# Patient Record
Sex: Male | Born: 1960 | Race: White | Hispanic: Yes | Marital: Married
Health system: Southern US, Community
[De-identification: ages and names within clinical notes are randomized; demographics above are authoritative.]

## PROBLEM LIST (undated history)

## (undated) HISTORY — PX: NO PRIOR SURGERIES: 100

---

## 2001-08-19 ENCOUNTER — Encounter: Payer: Self-pay | Admitting: Emergency Medicine

## 2001-08-19 ENCOUNTER — Emergency Department (HOSPITAL_COMMUNITY): Admission: EM | Admit: 2001-08-19 | Discharge: 2001-08-19 | Payer: Self-pay | Admitting: Emergency Medicine

## 2001-09-29 ENCOUNTER — Inpatient Hospital Stay (HOSPITAL_COMMUNITY): Admission: EM | Admit: 2001-09-29 | Discharge: 2001-09-30 | Payer: Self-pay | Admitting: Emergency Medicine

## 2001-09-29 ENCOUNTER — Encounter: Payer: Self-pay | Admitting: Internal Medicine

## 2001-09-29 ENCOUNTER — Encounter: Admission: RE | Admit: 2001-09-29 | Discharge: 2001-09-29 | Payer: Self-pay | Admitting: Internal Medicine

## 2004-10-21 ENCOUNTER — Ambulatory Visit (HOSPITAL_BASED_OUTPATIENT_CLINIC_OR_DEPARTMENT_OTHER): Admission: RE | Admit: 2004-10-21 | Discharge: 2004-10-21 | Payer: Self-pay | Admitting: Orthopedic Surgery

## 2004-10-21 ENCOUNTER — Ambulatory Visit (HOSPITAL_COMMUNITY): Admission: RE | Admit: 2004-10-21 | Discharge: 2004-10-21 | Payer: Self-pay | Admitting: Orthopedic Surgery

## 2010-08-08 ENCOUNTER — Emergency Department: Payer: Self-pay | Admitting: Unknown Physician Specialty

## 2013-10-28 ENCOUNTER — Emergency Department: Payer: Self-pay | Admitting: Emergency Medicine

## 2013-11-03 ENCOUNTER — Other Ambulatory Visit: Payer: Self-pay | Admitting: Orthopedic Surgery

## 2013-11-03 DIAGNOSIS — M25561 Pain in right knee: Secondary | ICD-10-CM

## 2013-11-05 ENCOUNTER — Ambulatory Visit
Admission: RE | Admit: 2013-11-05 | Discharge: 2013-11-05 | Disposition: A | Discharge status: Home / Self Care | Payer: BC Managed Care – PPO | Source: Ambulatory Visit | Attending: Orthopedic Surgery | Admitting: Orthopedic Surgery

## 2013-11-05 ENCOUNTER — Encounter (INDEPENDENT_AMBULATORY_CARE_PROVIDER_SITE_OTHER): Payer: Self-pay

## 2013-11-05 DIAGNOSIS — M25561 Pain in right knee: Secondary | ICD-10-CM

## 2013-12-18 ENCOUNTER — Emergency Department: Payer: Self-pay | Admitting: Emergency Medicine

## 2013-12-18 LAB — COMPREHENSIVE METABOLIC PANEL
Albumin: 3.7 g/dL (ref 3.4–5.0)
Alkaline Phosphatase: 81 U/L
Anion Gap: 8 (ref 7–16)
BUN: 26 mg/dL — ABNORMAL HIGH (ref 7–18)
Bilirubin,Total: 0.5 mg/dL (ref 0.2–1.0)
Calcium, Total: 8.7 mg/dL (ref 8.5–10.1)
Chloride: 104 mmol/L (ref 98–107)
Co2: 26 mmol/L (ref 21–32)
Creatinine: 1.5 mg/dL — ABNORMAL HIGH (ref 0.60–1.30)
EGFR (African American): >60
EGFR (Non-African Amer.): 54 — ABNORMAL LOW
Glucose: 141 mg/dL — ABNORMAL HIGH (ref 65–99)
Osmolality: 283 (ref 275–301)
Potassium: 3.8 mmol/L (ref 3.5–5.1)
SGOT(AST): 29 U/L (ref 15–37)
SGPT (ALT): 51 U/L
Sodium: 138 mmol/L (ref 136–145)
Total Protein: 7.4 g/dL (ref 6.4–8.2)

## 2013-12-18 LAB — CBC
HCT: 41.1 % (ref 40.0–52.0)
HGB: 13.7 g/dL (ref 13.0–18.0)
MCH: 31.2 pg (ref 26.0–34.0)
MCHC: 33.3 g/dL (ref 32.0–36.0)
MCV: 94 fL (ref 80–100)
Platelet: 149 10*3/uL — ABNORMAL LOW (ref 150–440)
RBC: 4.38 10*6/uL — ABNORMAL LOW (ref 4.40–5.90)
RDW: 12.5 % (ref 11.5–14.5)
WBC: 6.6 10*3/uL (ref 3.8–10.6)

## 2013-12-18 LAB — URINALYSIS, COMPLETE
Bacteria: NONE SEEN
Bilirubin,UR: NEGATIVE
Glucose,UR: NEGATIVE mg/dL (ref 0–75)
Ketone: NEGATIVE
Leukocyte Esterase: NEGATIVE
Nitrite: NEGATIVE
Ph: 5 (ref 4.5–8.0)
Protein: 25
RBC,UR: 117 /HPF (ref 0–5)
Specific Gravity: 1.02 (ref 1.003–1.030)
Squamous Epithelial: NONE SEEN
WBC UR: 1 /HPF (ref 0–5)

## 2015-07-29 IMAGING — CT CT STONE STUDY
3 of 4 series · 5 of 16 positions shown, 6 images · non-contrast
Comparison: Prior CT from 08/08/2010

CLINICAL DATA: Left flank pain

EXAM:
CT ABDOMEN AND PELVIS WITHOUT CONTRAST
TECHNIQUE: Multidetector CT imaging of the abdomen and pelvis was performed
following the standard protocol without IV contrast.

[Series 4: lung · axial · 0.74mm/px · z∈[-544,-544]mm · 1 of 32 slices shown, 2 images]
[im 1/32  soft-tissue]
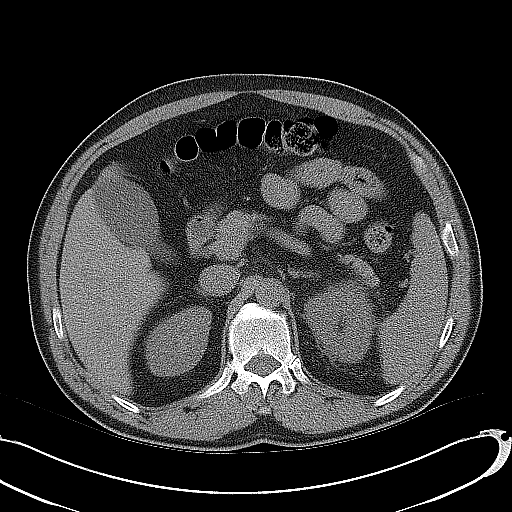
[im 1/32  bone]
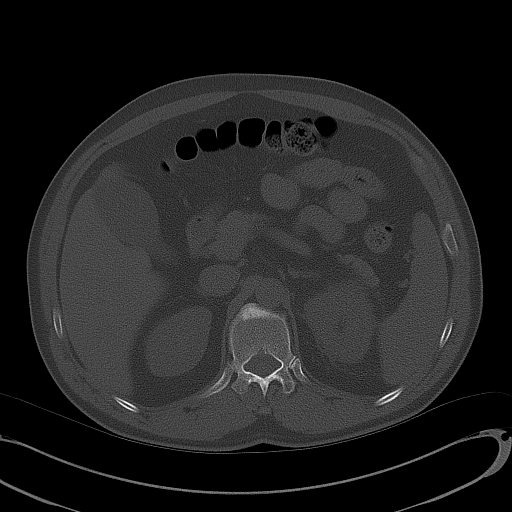

[Series 5: coronal · coronal · 0.74mm/px · 3 of 126 slices shown]
[im 32/126  soft-tissue]
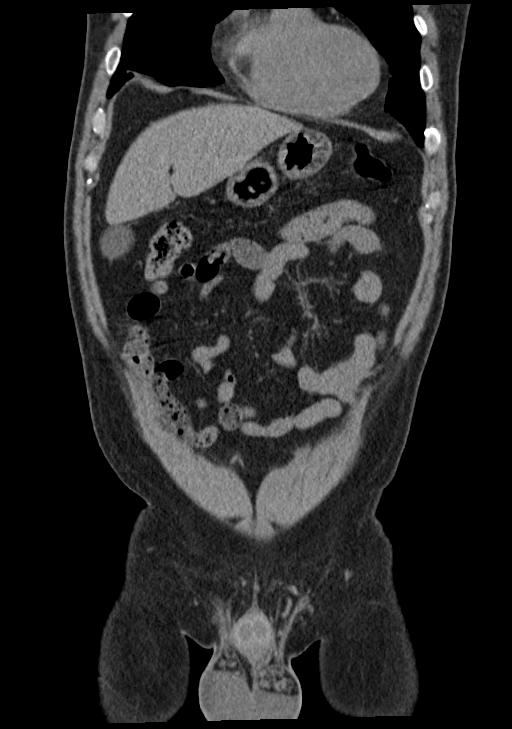
[im 63/126  soft-tissue]
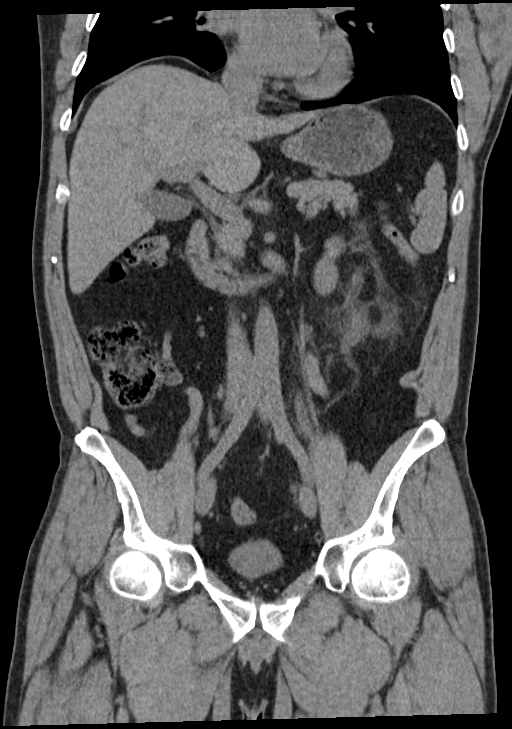
[im 94/126  soft-tissue]
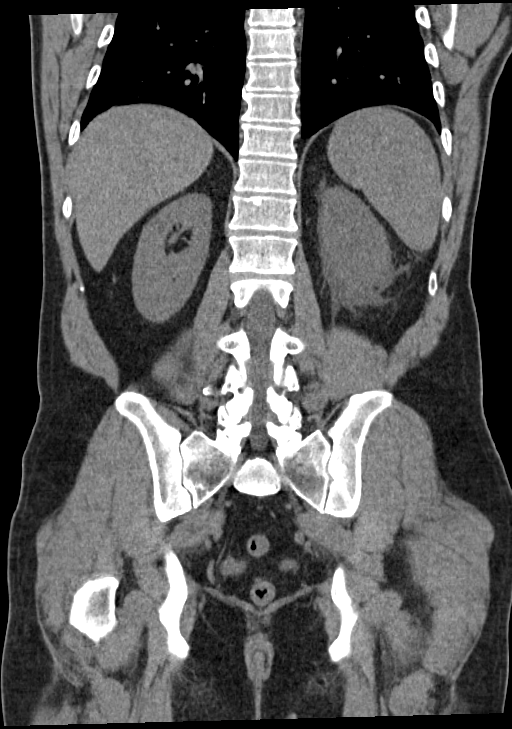

[Series 6: sagittal · sagittal · 0.58mm/px · 1 of 162 slices shown]
[im 65/162  soft-tissue]
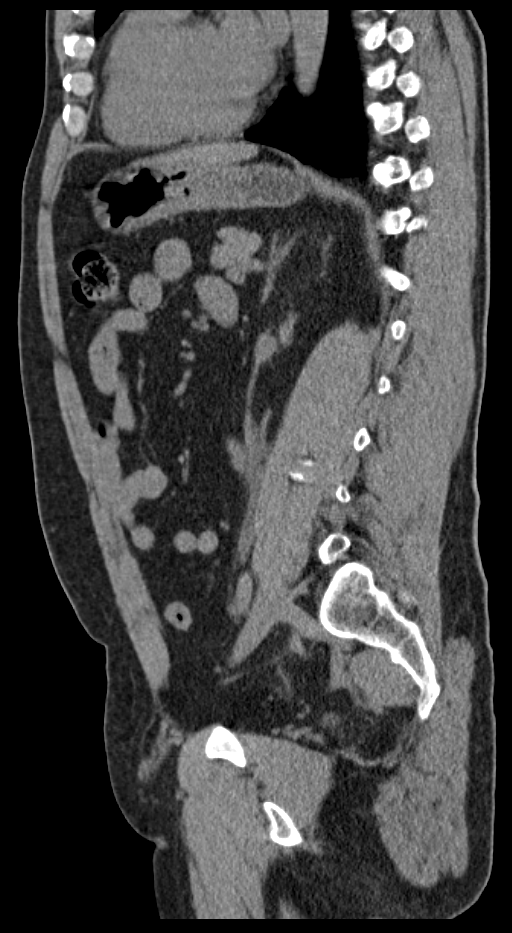

[5 of 16 positions shown; findings below may reference images not displayed]

FINDINGS: Mild subpleural fibrosis noted within the posteromedial right lung
base. The visualized lung bases are otherwise clear.

Limited noncontrast evaluation of the liver is unremarkable.
Gallbladder within normal limits. No biliary ductal dilatation. The
spleen, adrenal glands, and pancreas demonstrate a normal unenhanced
appearance.

Small 3 mm nonobstructive calculus present within the lower pole of
the right kidney. Right kidney is otherwise unremarkable without
evidence of hydronephrosis. No stones seen along the course of the
right renal collecting system. There is neural right-sided
hydroureter.

On the left, in obstructive 4 mm calculus present within the distal
left ureter (series 2, image 81). There is secondary moderate left
hydroureteronephrosis with left perinephric and periureteral fat
stranding. No other stones seen along the course of the left renal
collecting system. Additional tiny punctate 2 mm calculus present
within the interpolar left kidney (series 5, image 75).

Stomach within normal limits. No evidence of bowel obstruction.
Appendix is absent. No acute inflammatory changes seen about the
bowels.

Bladder is decompressed and not well evaluated. Prostate within
normal limits.

No free air or fluid. No pathologically enlarged lymph nodes seen
within the abdomen and pelvis.

No acute osseous abnormality. No worrisome lytic or blastic osseous
lesions. Multilevel degenerative endplate spurring seen anteriorly
within the lower thoracic spine.
IMPRESSION: 1. Obstructive 4 mm stone within the distal left ureter with
secondary moderate left hydroureteronephrosis.
2. Additional bilateral nonobstructive nephrolithiasis as above.

## 2017-08-06 DIAGNOSIS — J302 Other seasonal allergic rhinitis: Secondary | ICD-10-CM | POA: Diagnosis not present

## 2017-08-06 DIAGNOSIS — Z125 Encounter for screening for malignant neoplasm of prostate: Secondary | ICD-10-CM | POA: Diagnosis not present

## 2017-08-06 DIAGNOSIS — Z131 Encounter for screening for diabetes mellitus: Secondary | ICD-10-CM | POA: Diagnosis not present

## 2017-08-06 DIAGNOSIS — Z0000 Encounter for general adult medical examination without abnormal findings: Secondary | ICD-10-CM | POA: Diagnosis not present

## 2017-08-06 DIAGNOSIS — Z13220 Encounter for screening for lipoid disorders: Secondary | ICD-10-CM | POA: Diagnosis not present

## 2017-09-27 DIAGNOSIS — S0501XA Injury of conjunctiva and corneal abrasion without foreign body, right eye, initial encounter: Secondary | ICD-10-CM | POA: Diagnosis not present

## 2017-12-01 DIAGNOSIS — E7849 Other hyperlipidemia: Secondary | ICD-10-CM | POA: Diagnosis not present

## 2017-12-01 DIAGNOSIS — K210 Gastro-esophageal reflux disease with esophagitis: Secondary | ICD-10-CM | POA: Diagnosis not present

## 2017-12-01 DIAGNOSIS — Z1211 Encounter for screening for malignant neoplasm of colon: Secondary | ICD-10-CM | POA: Diagnosis not present

## 2017-12-01 DIAGNOSIS — J302 Other seasonal allergic rhinitis: Secondary | ICD-10-CM | POA: Diagnosis not present

## 2018-08-23 ENCOUNTER — Other Ambulatory Visit (HOSPITAL_BASED_OUTPATIENT_CLINIC_OR_DEPARTMENT_OTHER)
Admit: 2018-08-23 | Discharge: 2018-08-23 | Disposition: A | Discharge status: Home / Self Care | Payer: MEDICARE | Attending: Internal Medicine | Admitting: Internal Medicine

## 2018-08-23 ENCOUNTER — Other Ambulatory Visit: Payer: Self-pay | Admitting: Internal Medicine

## 2018-08-23 DIAGNOSIS — Z1159 Encounter for screening for other viral diseases: Secondary | ICD-10-CM | POA: Insufficient documentation

## 2018-08-23 DIAGNOSIS — Z1383 Encounter for screening for respiratory disorder NEC: Secondary | ICD-10-CM | POA: Insufficient documentation

## 2018-08-24 LAB — COVID-19 CORONAVIRUS QUALITATIVE PCR

## 2018-12-02 DIAGNOSIS — Z125 Encounter for screening for malignant neoplasm of prostate: Secondary | ICD-10-CM | POA: Diagnosis not present

## 2018-12-02 DIAGNOSIS — Z131 Encounter for screening for diabetes mellitus: Secondary | ICD-10-CM | POA: Diagnosis not present

## 2018-12-02 DIAGNOSIS — E7849 Other hyperlipidemia: Secondary | ICD-10-CM | POA: Diagnosis not present

## 2018-12-02 DIAGNOSIS — K625 Hemorrhage of anus and rectum: Secondary | ICD-10-CM | POA: Diagnosis not present

## 2019-02-06 DIAGNOSIS — K625 Hemorrhage of anus and rectum: Secondary | ICD-10-CM | POA: Diagnosis not present

## 2019-03-01 DIAGNOSIS — Z1159 Encounter for screening for other viral diseases: Secondary | ICD-10-CM | POA: Diagnosis not present

## 2019-03-06 DIAGNOSIS — Z1211 Encounter for screening for malignant neoplasm of colon: Secondary | ICD-10-CM | POA: Diagnosis not present

## 2019-03-06 DIAGNOSIS — K648 Other hemorrhoids: Secondary | ICD-10-CM | POA: Diagnosis not present

## 2019-03-06 DIAGNOSIS — D125 Benign neoplasm of sigmoid colon: Secondary | ICD-10-CM | POA: Diagnosis not present

## 2019-03-08 DIAGNOSIS — D125 Benign neoplasm of sigmoid colon: Secondary | ICD-10-CM | POA: Diagnosis not present

## 2019-04-06 DIAGNOSIS — S46212A Strain of muscle, fascia and tendon of other parts of biceps, left arm, initial encounter: Secondary | ICD-10-CM | POA: Diagnosis not present

## 2019-04-06 DIAGNOSIS — M25511 Pain in right shoulder: Secondary | ICD-10-CM | POA: Diagnosis not present

## 2019-04-06 DIAGNOSIS — G8929 Other chronic pain: Secondary | ICD-10-CM | POA: Diagnosis not present

## 2019-07-27 NOTE — Progress Notes (Signed)
Mr. Gertner did not cancel and was not present for a scheduled appointment today.  Disposition: New patient to clinic, no prior visits or history, no follow-up planned. He can schedule with any provider to establish as needed.

## 2019-08-02 ENCOUNTER — Encounter (HOSPITAL_BASED_OUTPATIENT_CLINIC_OR_DEPARTMENT_OTHER): Payer: Self-pay | Admitting: Unknown Physician Specialty

## 2019-08-02 ENCOUNTER — Encounter (HOSPITAL_BASED_OUTPATIENT_CLINIC_OR_DEPARTMENT_OTHER): Payer: Self-pay

## 2019-08-04 NOTE — Progress Notes (Signed)
Patient did not present for today's scheduled visit.

## 2019-08-15 ENCOUNTER — Encounter (HOSPITAL_BASED_OUTPATIENT_CLINIC_OR_DEPARTMENT_OTHER): Payer: Self-pay

## 2019-08-15 DIAGNOSIS — Z7189 Other specified counseling: Secondary | ICD-10-CM

## 2019-08-15 NOTE — Progress Notes (Signed)
Spanish interpreter was used in this encounter:    [facility] RN Documentation.    Situation/Complaint:Mr. Lorella Nimrod approaches TW at the dining area at [facility] -satellite location of [facility]. Pt. States: 'Are you the nurse? I need help. My eyes.' TW brings patient to the nurse office for a visit.    Site Where Served: [facility] [employer] - satellite location of [facility]    Where Slept Last Night: shelter    Living Arrangements: homeless    Pt's JTT:SVXB at this time    Assessment:  General:Pt. Is wearing a T-shirt and jeans with shoes. Pt. Is wearing a mask appropriately. Pt. Presents with good hygiene.  HEENT:Pt. States: 'My eyes there's some flesh growing in there. I can see maybe 90% not 100%.' Pt. Denies any pain.  Respiratory:no respiratory distress, able to finish sentences  Gastrointestinal:Pt. States he is eating and drinking lots of water.   Genitourinary:pt. Denies any issues ; no incontinence  Neurological: A & O x 4. Pt. Able to make needs known.  Musculoskeletal:ambulates in the shelter, steady even gait.  Skin:visible skin is relevant to ethnicity; skin intact  Psychiatric:Pt. Is calm and pleasant.  CV:pt. Appears well perfused.    Vitals:pt. declines    Referrals:TW assisted pt. With getting an appointment at [facility] to establish care      Plan and follow-up:   -08/31/2019 appt to establish care with PCP at [facility] and follow with eye care referral  -Pt. Aware of nursing services and encouraged to use nursing services for health needs.      Self Management Goal:Mr.Garciawas involved in the development of this plan and will try his best to adhere to it.

## 2019-08-24 ENCOUNTER — Ambulatory Visit: Payer: Self-pay

## 2019-08-24 DIAGNOSIS — Z23 Encounter for immunization: Secondary | ICD-10-CM

## 2019-08-31 ENCOUNTER — Encounter (HOSPITAL_BASED_OUTPATIENT_CLINIC_OR_DEPARTMENT_OTHER): Payer: Self-pay | Admitting: Adult Health

## 2019-09-21 ENCOUNTER — Ambulatory Visit: Payer: Self-pay

## 2019-09-21 DIAGNOSIS — Z23 Encounter for immunization: Secondary | ICD-10-CM

## 2019-09-28 DIAGNOSIS — L720 Epidermal cyst: Secondary | ICD-10-CM | POA: Diagnosis not present

## 2020-05-03 ENCOUNTER — Ambulatory Visit: Payer: MEDICARE | Attending: Internal Medicine

## 2020-05-03 ENCOUNTER — Other Ambulatory Visit (HOSPITAL_BASED_OUTPATIENT_CLINIC_OR_DEPARTMENT_OTHER): Payer: Self-pay | Admitting: Internal Medicine

## 2020-05-03 DIAGNOSIS — Z23 Encounter for immunization: Secondary | ICD-10-CM | POA: Insufficient documentation

## 2020-05-03 MED ORDER — COVID-19 MRNA VACCINE (PFIZER) 30 MCG/0.3ML IM SUSP
30.0000 ug | Freq: Once | INTRAMUSCULAR | Status: AC
Start: 2020-05-03 — End: 2020-05-03
  Administered 2020-05-03: 30 ug via INTRAMUSCULAR

## 2020-05-03 NOTE — Progress Notes (Signed)
Vaccine Screening Questions    Interpreter: No    1. Are you allergic to Latex? NO    2.  Have you had a serious reaction or an allergic reaction to a vaccine?  NO    3.  Currently have a moderate or severe illness, including fever?  NO    4.  Ever had a seizure or any neurological problem associated with a vaccine? (DTaP/TDaP/DTP pertinent) NO    5.  Is patient receiving any live vaccinations today? (Varicella-Chickenpox, MMR-Measles/Mumps/Rubella, Zoster-Shingles, Flumist, Yellow Fever) NOTE: oral rotavirus is exempt  NO    If YES to any of the questions above – Do NOT give vaccine.  Consult with RN or provider in clinic.  (#5 can be YES if all Live vaccine questions are answered NO)    If NO to all questions above - Patient may receive vaccine.    6. Do you need to receive the Flu vaccine today? NO      All patients are encouraged to wait 15 minutes before leaving after receiving any vaccine.    VIS given 05/03/2020 by Dahl Higinbotham L Yvette Roark, CMA.

## 2022-01-18 ENCOUNTER — Other Ambulatory Visit: Payer: Self-pay

## 2022-01-18 ENCOUNTER — Emergency Department: Payer: BC Managed Care – PPO

## 2022-01-18 ENCOUNTER — Emergency Department
Admission: EM | Admit: 2022-01-18 | Discharge: 2022-01-19 | Disposition: A | Discharge status: Home / Self Care | Payer: BC Managed Care – PPO | Attending: Emergency Medicine | Admitting: Emergency Medicine

## 2022-01-18 DIAGNOSIS — R109 Unspecified abdominal pain: Secondary | ICD-10-CM

## 2022-01-18 DIAGNOSIS — N132 Hydronephrosis with renal and ureteral calculous obstruction: Secondary | ICD-10-CM | POA: Insufficient documentation

## 2022-01-18 DIAGNOSIS — N200 Calculus of kidney: Secondary | ICD-10-CM

## 2022-01-18 DIAGNOSIS — N201 Calculus of ureter: Secondary | ICD-10-CM

## 2022-01-18 LAB — COMPREHENSIVE METABOLIC PANEL
ALT: 38 U/L (ref 0–44)
AST: 37 U/L (ref 15–41)
Albumin: 4.5 g/dL (ref 3.5–5.0)
Alkaline Phosphatase: 56 U/L (ref 38–126)
Anion gap: 13 (ref 5–15)
BUN: 28 mg/dL — ABNORMAL HIGH (ref 6–20)
CO2: 23 mmol/L (ref 22–32)
Calcium: 9 mg/dL (ref 8.9–10.3)
Chloride: 103 mmol/L (ref 98–111)
Creatinine, Ser: 1.33 mg/dL — ABNORMAL HIGH (ref 0.61–1.24)
GFR, Estimated: >60 mL/min (ref >60)
Glucose, Bld: 110 mg/dL — ABNORMAL HIGH (ref 70–99)
Potassium: 3.9 mmol/L (ref 3.5–5.1)
Sodium: 139 mmol/L (ref 135–145)
Total Bilirubin: 0.8 mg/dL (ref 0.3–1.2)
Total Protein: 7.8 g/dL (ref 6.5–8.1)

## 2022-01-18 LAB — URINALYSIS, ROUTINE W REFLEX MICROSCOPIC
Bacteria, UA: NONE SEEN
Bilirubin Urine: NEGATIVE
Glucose, UA: NEGATIVE mg/dL
Ketones, ur: NEGATIVE mg/dL
Leukocytes,Ua: NEGATIVE
Nitrite: NEGATIVE
Protein, ur: NEGATIVE mg/dL
RBC / HPF: >50 RBC/hpf — ABNORMAL HIGH (ref 0–5)
Specific Gravity, Urine: 1.023 (ref 1.005–1.030)
Squamous Epithelial / LPF: NONE SEEN (ref 0–5)
pH: 5 (ref 5.0–8.0)

## 2022-01-18 LAB — CBC WITH DIFFERENTIAL/PLATELET
Abs Immature Granulocytes: 0.02 10*3/uL (ref 0.00–0.07)
Basophils Absolute: 0 10*3/uL (ref 0.0–0.1)
Basophils Relative: 0 %
Eosinophils Absolute: 0.1 10*3/uL (ref 0.0–0.5)
Eosinophils Relative: 2 %
HCT: 43.8 % (ref 39.0–52.0)
Hemoglobin: 14.6 g/dL (ref 13.0–17.0)
Immature Granulocytes: 0 %
Lymphocytes Relative: 19 %
Lymphs Abs: 1.1 10*3/uL (ref 0.7–4.0)
MCH: 30.9 pg (ref 26.0–34.0)
MCHC: 33.3 g/dL (ref 30.0–36.0)
MCV: 92.8 fL (ref 80.0–100.0)
Monocytes Absolute: 0.4 10*3/uL (ref 0.1–1.0)
Monocytes Relative: 8 %
Neutro Abs: 3.9 10*3/uL (ref 1.7–7.7)
Neutrophils Relative %: 71 %
Platelets: 163 10*3/uL (ref 150–400)
RBC: 4.72 MIL/uL (ref 4.22–5.81)
RDW: 12.1 % (ref 11.5–15.5)
WBC: 5.5 10*3/uL (ref 4.0–10.5)
nRBC: 0 % (ref 0.0–0.2)

## 2022-01-18 MED ORDER — OXYCODONE-ACETAMINOPHEN 5-325 MG PO TABS
1.0000 | ORAL_TABLET | ORAL | Status: DC | PRN
  Administered 2022-01-18: 1 via ORAL
  Filled 2022-01-18: qty 1

## 2022-01-18 NOTE — ED Provider Notes (Signed)
[facility] Provider Note    Event Date/Time   First MD Initiated Contact with Patient 01/18/22 2353     (approximate)   History   Flank Pain   HPI  [facility] Luis Phillips is a 61 y.o. male who presents to the ED for evaluation of Flank Pain   Patient presents to the ED with his wife and daughter for evaluation of resolving right-sided flank pain.  He reports history of kidney stones in the past and this 1 felt similar.  The pain started early this morning and has been present throughout the day.  Reports some associated diaphoresis and nausea with the pain, but no fevers or dysuria.  Due to the severity and persistence of the pain he presents to the ED for evaluation.  By the time I see him, he has waited a couple hours and reports resolution of the pain.  No complaints.   Physical Exam   Triage Vital Signs: ED Triage Vitals  Enc Vitals Group     BP 01/18/22 1936 (!) 182/94     Pulse Rate 01/18/22 1936 76     Resp 01/18/22 1936 18     Temp 01/18/22 1936 98.1 F (36.7 C)     Temp Source 01/18/22 1936 Oral     SpO2 01/18/22 1936 96 %     Weight 01/18/22 1936 182 lb (82.6 kg)     Height 01/18/22 1935 5\' 6"  (1.676 m)     Head Circumference --      Peak Flow --      Pain Score 01/18/22 1942 5     Pain Loc --      Pain Edu? --      Excl. in GC? --     Most recent vital signs: Vitals:   01/18/22 1936  BP: (!) 182/94  Pulse: 76  Resp: 18  Temp: 98.1 F (36.7 C)  SpO2: 96%    General: Awake, no distress.  Sitting upright in the side of the bed and looks systemically well.  Pleasant and conversational. CV:  Good peripheral perfusion.  Resp:  Normal effort.  Abd:  No distention.  Soft and benign.  No CVA tenderness bilaterally. MSK:  No deformity noted.  Neuro:  No focal deficits appreciated. Other:     ED Results / Procedures / Treatments   Labs (all labs ordered are listed, but only abnormal results are displayed) Labs Reviewed   COMPREHENSIVE METABOLIC PANEL - Abnormal; Notable for the following components:      Result Value   Glucose, Bld 110 (*)    BUN 28 (*)    Creatinine, Ser 1.33 (*)    All other components within normal limits  URINALYSIS, ROUTINE W REFLEX MICROSCOPIC - Abnormal; Notable for the following components:   Color, Urine YELLOW (*)    APPearance CLEAR (*)    Hgb urine dipstick LARGE (*)    RBC / HPF >50 (*)    All other components within normal limits  CBC WITH DIFFERENTIAL/PLATELET    EKG   RADIOLOGY CT renal study interpreted by me with a distal right ureteral stone.  Official radiology report(s): CT Renal Stone Study  Result Date: 01/18/2022 CLINICAL DATA:  Flank pain EXAM: CT ABDOMEN AND PELVIS WITHOUT CONTRAST TECHNIQUE: Multidetector CT imaging of the abdomen and pelvis was performed following the standard protocol without IV contrast. RADIATION DOSE REDUCTION: This exam was performed according to the departmental dose-optimization program which includes automated exposure control, adjustment  of the mA and/or kV according to patient size and/or use of iterative reconstruction technique. COMPARISON:  12/18/2013 FINDINGS: Lower chest: Anterior aspect of the right hemidiaphragm is elevated. Small linear densities are seen in lower lung fields. Hepatobiliary: No focal abnormalities are seen in liver. There is no dilation of bile ducts. Gallbladder is unremarkable. Pancreas: No focal abnormalities are seen. Spleen: Unremarkable. Adrenals/Urinary Tract: Adrenals are unremarkable. There is no hydronephrosis in the left kidney. There is slight prominence of collecting system in right kidney. There is mild dilation of right ureter. In image 78 of series 2, there is a 2 mm calcific density in the distal course of right ureter close to the ureterovesical junction. There is no calcific density in the lumen of urinary bladder. Stomach/Bowel: Stomach is unremarkable. Small bowel loops are not dilated.  Appendix is not seen. There is no wall thickening and colon. There is no pericolic stranding. Vascular/Lymphatic: Unremarkable. Reproductive: Unremarkable. Other: There is no ascites or pneumoperitoneum. Small umbilical hernia containing fat is seen. Small bilateral inguinal hernias containing fat are noted. Musculoskeletal: No acute findings are seen. IMPRESSION: There is a 2 mm calculus in the distal right ureter close to the ureterovesical junction causing mild right hydronephrosis. No definite renal stones are seen. There is no evidence of intestinal obstruction or pneumoperitoneum. Other findings as described in the body of the report. Electronically Signed   By: Ernie Avena M.D.   On: 01/18/2022 20:20    PROCEDURES and INTERVENTIONS:  Procedures  Medications  oxyCODONE-acetaminophen (PERCOCET/ROXICET) 5-325 MG per tablet 1 tablet (1 tablet Oral Given 01/18/22 2229)     IMPRESSION / MDM / ASSESSMENT AND PLAN / ED COURSE  I reviewed the triage vital signs and the nursing notes.  Differential diagnosis includes, but is not limited to, kidney stone, pyelonephritis, UTI, aortic emergency  {Patient presents with symptoms of an acute illness or injury that is potentially life-threatening.  61 year old male presents to the ED with recurrence of ureteral colic and ultimately suitable for outpatient management.  Pain resolved while waiting and otherwise asymptomatic.  No infectious features to suggest sepsis, pyelonephritis or cystitis.  Metabolic panel with renal dysfunction around his chronic baseline, CBC is normal.  Urine without infectious features.  CT confirms a distal small ureteral stone that I suspect he passes.  He is discharged with symptomatic measures and return precautions.      FINAL CLINICAL IMPRESSION(S) / ED DIAGNOSES   Final diagnoses:  Right flank pain  Ureterolithiasis  Nephrolithiasis     Rx / DC Orders   ED Discharge Orders          Ordered    oxyCODONE  (ROXICODONE) 5 MG immediate release tablet  Every 8 hours PRN        01/19/22 0008    ondansetron (ZOFRAN-ODT) 4 MG disintegrating tablet  Every 8 hours PRN        01/19/22 0008             Note:  This document was prepared using Dragon voice recognition software and may include unintentional dictation errors.   Delton Prairie, MD 01/19/22 [phone]

## 2022-01-18 NOTE — ED Triage Notes (Signed)
Right flank pain starting today.Pain with urination. Denies hematuria. Hx of kidney stones

## 2022-01-19 MED ORDER — OXYCODONE HCL 5 MG PO TABS: 5.0000 mg | ORAL_TABLET | Freq: Three times a day (TID) | ORAL | 0 refills | Status: AC | PRN

## 2022-01-19 MED ORDER — ONDANSETRON 4 MG PO TBDP: 4.0000 mg | ORAL_TABLET | Freq: Three times a day (TID) | ORAL | 0 refills | Status: AC | PRN

## 2022-01-19 NOTE — Discharge Instructions (Addendum)
Please take Tylenol and ibuprofen/Advil for your pain.  It is safe to take them together, or to alternate them every few hours.  Take up to 1000mg  of Tylenol at a time, up to 4 times per day.  Do not take more than 4000 mg of Tylenol in 24 hours.  For ibuprofen, take 400-600 mg, 3 - 4 times per day.  Use oxycodone as needed for more severe/breakthrough pain.  The Zofran as needed for nausea and vomiting

## 2023-01-11 ENCOUNTER — Other Ambulatory Visit: Payer: Self-pay | Admitting: Internal Medicine

## 2023-01-12 LAB — LIPID PANEL
Cholesterol: 203 mg/dL — ABNORMAL HIGH (ref <200)
HDL: 64 mg/dL (ref >=40)
LDL Cholesterol (Calc): 107 mg/dL — ABNORMAL HIGH
Non-HDL Cholesterol (Calc): 139 mg/dL — ABNORMAL HIGH (ref <130)
Total CHOL/HDL Ratio: 3.2 (calc) (ref <5.0)
Triglycerides: 197 mg/dL — ABNORMAL HIGH (ref <150)

## 2023-01-12 LAB — COMPLETE METABOLIC PANEL WITH GFR
AG Ratio: 1.8 (calc) (ref 1.0–2.5)
ALT: 42 U/L (ref 9–46)
AST: 27 U/L (ref 10–35)
Albumin: 4.4 g/dL (ref 3.6–5.1)
Alkaline phosphatase (APISO): 59 U/L (ref 35–144)
BUN: 19 mg/dL (ref 7–25)
CO2: 24 mmol/L (ref 20–32)
Calcium: 9.1 mg/dL (ref 8.6–10.3)
Chloride: 104 mmol/L (ref 98–110)
Creat: 1.02 mg/dL (ref 0.70–1.30)
Globulin: 2.5 g/dL (ref 1.9–3.7)
Glucose, Bld: 94 mg/dL (ref 65–99)
Potassium: 3.9 mmol/L (ref 3.5–5.3)
Sodium: 140 mmol/L (ref 135–146)
Total Bilirubin: 0.6 mg/dL (ref 0.2–1.2)
Total Protein: 6.9 g/dL (ref 6.1–8.1)
eGFR: 85 mL/min/{1.73_m2} (ref >=60)

## 2023-01-12 LAB — CBC
HCT: 44.2 % (ref 38.5–50.0)
Hemoglobin: 15 g/dL (ref 13.2–17.1)
MCH: 31.4 pg (ref 27.0–33.0)
MCHC: 33.9 g/dL (ref 32.0–36.0)
MCV: 92.5 fL (ref 80.0–100.0)
MPV: 10.6 fL (ref 7.5–12.5)
Platelets: 143 10*3/uL (ref 140–400)
RBC: 4.78 10*6/uL (ref 4.20–5.80)
RDW: 12 % (ref 11.0–15.0)
WBC: 3.1 10*3/uL — ABNORMAL LOW (ref 3.8–10.8)

## 2023-01-12 LAB — PSA: PSA: 0.37 ng/mL (ref <=4.00)

## 2023-01-12 LAB — VITAMIN D 25 HYDROXY (VIT D DEFICIENCY, FRACTURES): Vit D, 25-Hydroxy: 46 ng/mL (ref 30–100)

## 2023-04-20 ENCOUNTER — Ambulatory Visit: Payer: Self-pay | Attending: Nurse Practitioner

## 2023-04-20 ENCOUNTER — Other Ambulatory Visit (HOSPITAL_BASED_OUTPATIENT_CLINIC_OR_DEPARTMENT_OTHER): Payer: Self-pay

## 2023-04-20 ENCOUNTER — Encounter (HOSPITAL_BASED_OUTPATIENT_CLINIC_OR_DEPARTMENT_OTHER): Payer: Self-pay

## 2023-04-20 ENCOUNTER — Ambulatory Visit (HOSPITAL_BASED_OUTPATIENT_CLINIC_OR_DEPARTMENT_OTHER): Payer: Self-pay | Admitting: Nurse Practitioner

## 2023-04-20 VITALS — BP 136/95 | HR 67 | Temp 97.9°F | Wt 197.5 lb

## 2023-04-20 DIAGNOSIS — H04129 Dry eye syndrome of unspecified lacrimal gland: Secondary | ICD-10-CM

## 2023-04-20 DIAGNOSIS — H11003 Unspecified pterygium of eye, bilateral: Secondary | ICD-10-CM

## 2023-04-20 DIAGNOSIS — H538 Other visual disturbances: Secondary | ICD-10-CM | POA: Insufficient documentation

## 2023-04-20 LAB — GLUCOSE POC, HMC: Glucose (POC): 98 mg/dL (ref 62–125)

## 2023-04-20 MED ORDER — LUBRIFRESH P.M. OP OINT
TOPICAL_OINTMENT | Freq: Every evening | OPHTHALMIC | 2 refills | Status: DC | PRN
Start: 2023-04-20 — End: 2023-07-19
  Filled 2023-04-20: qty 3.5, 7d supply, fill #0

## 2023-04-20 MED ORDER — CARBOXYMETHYLCELLULOSE SODIUM 0.5 % OP SOLN
1.0000 [drp] | OPHTHALMIC | 2 refills | Status: DC | PRN
Start: 2023-04-20 — End: 2023-04-22
  Filled 2023-04-20: qty 30, 28d supply, fill #0

## 2023-04-20 NOTE — Progress Notes (Signed)
 Patient arrives to clinic triage.   He is asking to see someone for blurry vision.  Eyes are red, tearing very often.   Currently seeing "debris" in bilateral eyes.  This has been present for quite some time, but redness is much worse past week or so.  Walk in appointment today and established care going forward.   Speaking with financial, as well.

## 2023-04-20 NOTE — Progress Notes (Signed)
 [facility]    Subjective:   Zachary Short  is a 62 year old year old male who presents on 04/20/2023    Current Concerns:    Today w/ years of progressively worsening vision changes. Notes fleshy abnormality of bilateral eyes. Notes eyes are dry and itchy. He is using honey in his eyes to help w/ the irritation.     Describes vision as blurry w/ floaters. No double vision. No pain of eye or pain w/ eye movement.     NO hx of diabetes. No polyuria or polydipsia    Does not have PCP. Staying in [facility] in [place].     Interpreter  Yes - in person      There is no problem list on file for this patient.       No questionnaires on file.   Objective:   Vitals: BP (!) 136/95   Pulse 67   Temp 36.6 C (Temporal)   Wt 89.6 kg (197 lb 8 oz)   SpO2 97%     Physical Exam:  General: Healthy, alert, no distress, oriented x 3.  Skin: Skin color and texture normal for ethnicity.   Head: Normocephalic. No masses, lesions, tenderness or abnormalities.  Eyes: Lids/periorbital skin intact. Conjunctivae injected w/ thickened lens of bilateral sclera that overlaps iris. Corneas clear, PERRL, EOM's intact.  Lungs: Clear to auscultation bilaterally.  No rales, rhonchi, wheezing.   Heart: Regular rate and rhythm. No murmurs, clicks, or gallops.  Msk: Gait steady.   Neuro: Speech clear.  Psych: Stable mood and affect. Memory appears intact. Judgement/insight intact.      Assessment and Plan:    Zachary Short was seen today for other.    Blurry vision, bilateral  Pterygium of both eyes  Dry eye  Today w/ chronic bilateral dry eye w/ blurry vision. Visible pterygium of both eyes. Bilateral growths are near cornea likely causing vision changes. Possible he has other underlying process leading to vision changes, but chronic and no acute changes. No s/s concerning of stroke, retinal detachment, injection, diabetes, etc. Will refer to eye clinic and start treatment for dry eyes. He will make appt for PCP.   -     POC Glucose, Whole  Blood - Manual  -     White Petrolatum-Mineral Oil (LubriFresh P.M.) ophthalmic ointment; Apply to each EYE at bedtime as needed for dry eyes.  Dispense: 3.5 g; Refill: 2  -     carboxymethylcellulose 0.5 % ophthalmic solution; Place 1-2 drops in each EYE every hour as needed for dry eyes.  Dispense: 30 mL; Refill: 2  -     Referral to [facility]; Future    I spent a total of 30 minutes for the patient's care on the date of the service.      RTC:  PRN and establish care       Health Maintenance   Topic Date Due    Hepatitis C Screening  Never done    Prostate Cancer Screening Education  Never done    Depression Screening (PHQ-2)  Never done    HIV Screening  Never done    DTaP, Tdap and Td Vaccines (1 - Tdap) Never done    Lipid Disorders Screening  Never done    Colorectal Cancer Screening  Never done    Zoster Vaccine (1 of 2) Never done    Hepatitis B Vaccine (3 of 3 - Hep B Twinrix 3-dose series) 10/23/2019  COVID-19 Vaccine (7 - 2024-25 season) 01/17/2023    Influenza Vaccine (1) 02/16/2023    Hepatitis A Vaccine  Aged Out    Pneumococcal Vaccine: Pediatrics (0-5 years) and At-Risk Patients (6-64 years)  Aged Out    HPV Vaccine  Aged Out     ===================================================================  Future Appointments   Date Time Provider Department Center   04/22/2023 10:00 AM Bernfeld, Gevena Mart, MD [facility]          Lamar Laundry, [facility]  [facility]  [facility]  [facility]

## 2023-04-21 NOTE — Progress Notes (Unsigned)
[facility]    Date: 04/22/2023    Subjective: {VT  History items are no longer counted. You can document only relevant history and ROS items  Problem List  Medical Hx  Surgical Hx  Family Hx  Substance & Sexual Hx  Social Documentation  Obstetric Hx   Birth Hx :999}    Zachary Short is a 62 year old man who presents to establish care and discuss ***    A Spanish-speaking *** interpreter facilitates the visit.    Chart Review:  Seen for walk in visit at [facility] by Lamar Laundry ARNP on 04/20/23 for progressive vision changes (blurry with floaters, dry and itchy eyes, using honey in eyes), staying at [facility] in [place]. Found to have pterygium and dry eyes - referred to eye clinic and started Lubrifresh ointment QHS, artificial teras as needed for dry eye.    Today:      ALLERGY  Review of patient's allergies indicates:  No Known Allergies    MEDICATIONS  Current Outpatient Medications   Medication Instructions    carboxymethylcellulose 0.5 % ophthalmic solution 1-2 drops, Both EYES, Every 1 hour PRN    White Petrolatum-Mineral Oil (LubriFresh P.M.) ophthalmic ointment Both EYES, Nightly PRN       PROBLEM LIST/PAST MEDICAL HISTORY  Active Ambulatory Problems     Diagnosis Date Noted    No Active Ambulatory Problems     Resolved Ambulatory Problems     Diagnosis Date Noted    No Resolved Ambulatory Problems     No Additional Past Medical History       SURGICAL HISTORY  No past surgical history on file.    FAMILY HISTORY  Family History       No data available            SOCIAL HISTORY  Social History     Socioeconomic History    Marital status: Other     Social Drivers of Psychologist, prison and probation services Strain: Not on File (05/01/2022)    Received from [employer]     Financial Resource Strain: 0   Food Insecurity: Not on File (02/11/2023)    Received from [employer] Insecurity     Food: 0   Transportation Needs: Not on File (05/01/2022)    Received from  [employer] Needs     Transportation: 0   Physical Activity: Not on File (05/01/2022)    Received from [facility]    Physical Activity     Physical Activity: 0   Stress: Not on File (05/01/2022)    Received from [facility]    Stress     Stress: 0   Social Connections: Not on File (02/05/2023)    Received from [employer]    Social Connections     Connectedness: 0   Housing Stability: Not on File (05/01/2022)    Received from [employer]     Housing: 0         Objective: {VT  Vitals  Vitals Flowsheet  Labs  Imaging :999}   Vitals: There were no vitals taken for this visit.  Physical Exam  ***        Assessment and Plan: {VT  Problem List  Meds & Orders  HM  Care Gaps :999}   {VT  LOS Calculator  Job aid  AMA's MDM grid :999}  {Assessment & UJWJ:191478}  RTC    I spent a total of *** minutes for the patient's care on the date of the service.  {Vanishing Tip  When performed by provider on date of visit, these count toward total time  Chart review   History taking  Physical exam  Counseling, educating patient/family/caregiver  Orders   Referrals and communication with other providers (not separately reported)  Documentation  Care coordination (not separately reported)  Independent interpretation of results (not separately reported) and communicating results to patient/family/caregiver  :999}      Shireen Quan MD (she/her)

## 2023-04-22 ENCOUNTER — Other Ambulatory Visit (HOSPITAL_BASED_OUTPATIENT_CLINIC_OR_DEPARTMENT_OTHER): Payer: Self-pay

## 2023-04-22 ENCOUNTER — Ambulatory Visit
Payer: Self-pay | Attending: Student in an Organized Health Care Education/Training Program | Admitting: Student in an Organized Health Care Education/Training Program

## 2023-04-22 ENCOUNTER — Encounter (HOSPITAL_BASED_OUTPATIENT_CLINIC_OR_DEPARTMENT_OTHER): Payer: Self-pay | Admitting: Student in an Organized Health Care Education/Training Program

## 2023-04-22 VITALS — BP 141/83 | HR 76 | Temp 98.2°F | Ht 70.08 in | Wt 203.0 lb

## 2023-04-22 DIAGNOSIS — H538 Other visual disturbances: Secondary | ICD-10-CM | POA: Insufficient documentation

## 2023-04-22 DIAGNOSIS — Z0000 Encounter for general adult medical examination without abnormal findings: Secondary | ICD-10-CM | POA: Insufficient documentation

## 2023-04-22 DIAGNOSIS — Z23 Encounter for immunization: Secondary | ICD-10-CM | POA: Insufficient documentation

## 2023-04-22 DIAGNOSIS — H11003 Unspecified pterygium of eye, bilateral: Secondary | ICD-10-CM | POA: Insufficient documentation

## 2023-04-22 MED ORDER — COVID-19 MRNA VACC (MODERNA) 50 MCG/0.5ML IM SUSY
50.0000 ug | PREFILLED_SYRINGE | Freq: Once | INTRAMUSCULAR | Status: AC
Start: 2023-04-22 — End: 2023-04-22
  Administered 2023-04-22: 50 ug via INTRAMUSCULAR

## 2023-04-22 MED ORDER — COVID-19 MRNA VACC (MODERNA) 50 MCG/0.5ML IM SUSY
PREFILLED_SYRINGE | INTRAMUSCULAR | Status: AC
Start: 2023-04-22 — End: 2023-04-22
  Filled 2023-04-22: qty 0.5

## 2023-04-22 MED ORDER — CARBOXYMETHYLCELLULOSE SOD PF 0.5 % OP SOLN
2.0000 [drp] | OPHTHALMIC | 6 refills | Status: DC | PRN
Start: 2023-04-22 — End: 2023-07-19
  Filled 2023-04-22: qty 30, 3d supply, fill #0

## 2023-05-19 NOTE — Progress Notes (Signed)
 [facility]     Date: 05/20/2023    Subjective:     Zachary Short is a 63 year old man who presents today for follow-up of blurry vision/pterygium, elevated blood pressure reading, and discussion of healthcare maintenance    A video Spanish-speaking interpreter facilitates the visit.    Interval history:  Last seen by me 04/22/23 for establish care visit, discussed:  - Blurry vision and pterygium: attempted to expedite ophthalmology referral, changed eye drops to preservative free Refresh Plus artificial tears and continue Lubriderm ointment at night   - HCM: deferred to next visit per patient reference, received COVID-19 vaccine   - Elevated BP reading: 140s/80s, plan to repeat at follow-up visit    Today:  Vision symptoms are stable compared to last visit - blurry vision, floaters, dry eyes  Addressing vision remains his priority  Has not yet heard from eye clinic for scheduling   No other bothersome or new symptoms  Is interested in preventative health care screenings though prefers to address vision first   Open to pneumonia and tetanus vaccines today  Can't recall when he last had tetanus vaccine - may have been when he was staying at [facility]. [facility] 8 years ago but no documentation  Declines influenza vaccine - has not had a cold since he started receiving COVID-19 vaccines, had his COVID-19 vaccine last visit    Objective:    Vitals: BP 116/70   Pulse 62   Temp 36.2 C (Temporal)   Resp 14   Ht 5' 6.93 (1.7 m)   Wt 92.3 kg (203 lb 8 oz)   SpO2 97%   BMI 31.94 kg/m   Physical Exam  GEN: Well-appearing, no acute distress  HEENT: Pterygia present bilaterally, bilateral conjunctivae injected medially.  CHEST: Breathing unlabored in ambient air, speaking in full sentences.   NEURO: Alert, grossly oriented, moves all extremities spontaneously, steady and independent gait.   MENTAL STATUS: Thought process linear and future oriented.     Assessment and Plan:      Pterygium of both eyes  Blurry  vision, bilateral  Dry eye  Stable symptoms. History of gradually worsening vision and floaters with visual acuity 20/100 OU, 20/200 OD, and 20/200 OS in 04/2023. Pterygia are extending over iris, may be contributing to blurry vision due to induced astigmatism. PSQ referral coordinator has been unable to connect with eye clinic to expedite referral though will continue to work on this. I will also send message asking eye clinic to call patient for scheduling. He may need surgical excision if inducing significant astigmatism and at minimum would benefit from corrective lenses given profound blurry vision on visual acuity testing. Continue preservative free Refersh Plus artificial tears as needed, can continue Lubriderm ointment at night.     Healthcare maintenance  Discussed recommended preventative health screenings including diabetes screening, lipid screening, infectious disease screening (HIV, hepatitis), age appropriate cancer screenings including rationale for screening. Travion's priority is to address his vision and then he would consider routine health screenings. Will plan to re-visit after he can address vision with eye clinic.     History of elevated BP reading  BP normal 116/70 today, will continue to monitor periodically.     Need for vaccination  -     Tdap vaccine (Boostrix ) injection 0.5 mL  -     pneumococcal 20-valent conjugate vaccine (Prevnar 20 ) injection 0.5 mL  -     Declined flu vaccine - shared that since he  started receiving COVID-19 vaccine has never had a cold. We discussed that flu vaccine and COVID-19 vaccine protect against different viruses, he prefers to hold off on flu vaccine but might consider at a future appointment.    RTC in 3 months, sooner if needed     I spent a total of 30 minutes for the patient's care on the date of the service.    Chyrl EMERSON Celena Kathey MD (she/her)    Future visit goals:   - Pterygium/vision - follow-up ophtho referral   - Screening labs: A1C, lipid  panel, HAV/HBV, HCV, HIV, BMP, consider LFTs and CBC (none prior)  - Cancer screening: FIT test, discuss PSA  - Immunizations: Flu, Shingrix

## 2023-05-20 ENCOUNTER — Ambulatory Visit
Payer: Self-pay | Attending: Student in an Organized Health Care Education/Training Program | Admitting: Student in an Organized Health Care Education/Training Program

## 2023-05-20 VITALS — BP 116/70 | HR 62 | Temp 97.2°F | Resp 14 | Ht 66.93 in | Wt 203.5 lb

## 2023-05-20 DIAGNOSIS — Z23 Encounter for immunization: Secondary | ICD-10-CM | POA: Insufficient documentation

## 2023-05-20 DIAGNOSIS — H04129 Dry eye syndrome of unspecified lacrimal gland: Secondary | ICD-10-CM | POA: Insufficient documentation

## 2023-05-20 DIAGNOSIS — Z0000 Encounter for general adult medical examination without abnormal findings: Secondary | ICD-10-CM | POA: Insufficient documentation

## 2023-05-20 DIAGNOSIS — H538 Other visual disturbances: Secondary | ICD-10-CM | POA: Insufficient documentation

## 2023-05-20 DIAGNOSIS — H11003 Unspecified pterygium of eye, bilateral: Secondary | ICD-10-CM | POA: Insufficient documentation

## 2023-05-20 MED ORDER — PNEUMOCOCCAL 20-VAL CONJ VACC 0.5 ML IM SUSY
0.5000 mL | PREFILLED_SYRINGE | Freq: Once | INTRAMUSCULAR | Status: AC
Start: 2023-05-20 — End: 2023-05-20
  Administered 2023-05-20: 0.5 mL via INTRAMUSCULAR

## 2023-05-20 MED ORDER — TETANUS-DIPHTH-ACELL PERTUSSIS 5-2.5-18.5 LF-MCG/0.5 IM SUSP WRAPPER
INTRAMUSCULAR | Status: AC
Start: 2023-05-20 — End: 2023-05-20
  Filled 2023-05-20: qty 0.5

## 2023-05-20 MED ORDER — PNEUMOCOCCAL 20-VAL CONJ VACC 0.5 ML IM SUSY
PREFILLED_SYRINGE | INTRAMUSCULAR | Status: AC
Start: 2023-05-20 — End: 2023-05-20
  Filled 2023-05-20: qty 0.5

## 2023-05-20 MED ORDER — TETANUS-DIPHTH-ACELL PERTUSSIS 5-2.5-18.5 LF-MCG/0.5 IM SUSP WRAPPER
0.5000 mL | Freq: Once | INTRAMUSCULAR | Status: AC
Start: 2023-05-20 — End: 2023-05-20
  Administered 2023-05-20: 0.5 mL via INTRAMUSCULAR

## 2023-07-18 NOTE — Progress Notes (Unsigned)
 CHIEF COMPLAINT  No chief complaint on file.     *** - update - PRECHARTING 07/19/23     HISTORY OF PRESENT ILLNESS  Micajah Isiqui Finken is a 63 year old male who is referred from his PCP for evaluation of pterygia in both eyes. He saw his PCP 04/22/23 and visual acuity at that visit was 20/200 OD and 20/100 OS and a possible visually significant pterygium was noted for which he is referred. He reports 07/19/23 ***    PAST OCULAR HISTORY  ***    OCULAR MEDICATIONS  ***    GENERAL MEDICAL AND SURGICAL HISTORY  Past Medical History:   Diagnosis Date    NEGATIVE PAST MEDICAL HISTORY OF      Past Surgical History:   Procedure Laterality Date    NO PRIOR SURGERIES        Type *** diabetes mellitus (*** insulin)  - Dx: ***  - Meds: ***  - Comorbidities: ***  - Last A1c:   No results found for: "A1C"  Hypertension ***  Rheumatological disease ***  Prior radiation ***    ALLERGIES  Review of patient's allergies indicates:  No Known Allergies    SYSTEMIC MEDICATIONS  Outpatient Medications Prior to Visit   Medication Sig Dispense Refill    carboxymethylcellulose PF (Refresh Plus) 0.5 % ophthalmic solution Place 2 drops in each EYE every 2 hours as needed. 30 each 6    White Petrolatum-Mineral Oil (LubriFresh P.M.) ophthalmic ointment Apply to each EYE at bedtime as needed for dry eyes. 3.5 g 2     No facility-administered medications prior to visit.        FAMILY HISTORY  Family History       Problem (# of Occurrences) Relation (Name,Age of Onset)    No Known Problems (5) Mother, Brother, Sister, Sister, Sister    Other (1) Father: had surgery in [place] which improved his vision             SOCIAL HISTORY  Social History     Tobacco Use    Smoking status: Never    Smokeless tobacco: Never   Substance Use Topics    Alcohol use: Never    Drug use: Never        IMAGING  ***    TESTING  ***    ASSESSMENT & PLAN  # Dry eye syndrome, both eyes   # Pterygium, both eyes  - Appearance suggestive of pterygium not involving visual axis.  Patient is *** interested in surgery.   - ***Given otherwise excellent vision, discussed that surgical intervention will not be covered by Financial Assistance at this time. Discussed local clinics closer to pt's home to discuss.   - ***Given visual significance, recommend surgical intervention. ***  - Discussed importance of lubrication  - artificial tears four times a day, both eyes   - Lubricating ointment   - Warm compresses twice a day  - Wearing sunglasses/hat to mitigate effects of environmental risk factors like sunlight, wind, dust     If pre CEIOL  Consider monitoring with topography to stabilize for 3 months prior to proceeding with surgery.***    #***      RTC ***    Jaylanni Eltringham T. Ernest Haber, MD  Ophthalmology, PGY4

## 2023-07-19 ENCOUNTER — Ambulatory Visit
Payer: Self-pay | Attending: Student in an Organized Health Care Education/Training Program | Admitting: Student in an Organized Health Care Education/Training Program

## 2023-07-19 DIAGNOSIS — H04129 Dry eye syndrome of unspecified lacrimal gland: Secondary | ICD-10-CM | POA: Insufficient documentation

## 2023-07-19 DIAGNOSIS — H538 Other visual disturbances: Secondary | ICD-10-CM | POA: Insufficient documentation

## 2023-07-19 DIAGNOSIS — H11003 Unspecified pterygium of eye, bilateral: Secondary | ICD-10-CM | POA: Insufficient documentation

## 2023-07-19 MED ORDER — LUBRIFRESH P.M. OP OINT
TOPICAL_OINTMENT | Freq: Every evening | OPHTHALMIC | 11 refills | Status: AC | PRN
Start: 2023-07-19 — End: ?
  Filled 2023-07-19: qty 3.5, 30d supply, fill #0

## 2023-07-19 MED ORDER — CARBOXYMETHYLCELLULOSE SOD PF 0.5 % OP SOLN
2.0000 [drp] | OPHTHALMIC | 11 refills | Status: AC | PRN
Start: 2023-07-19 — End: ?
  Filled 2023-07-19: qty 30, 3d supply, fill #0

## 2023-07-20 ENCOUNTER — Other Ambulatory Visit (HOSPITAL_BASED_OUTPATIENT_CLINIC_OR_DEPARTMENT_OTHER): Payer: Self-pay

## 2023-07-25 NOTE — Progress Notes (Signed)
 I have not seen the patient, but I have reviewed the findings and agree with the report above.    Raylene Everts, MD  Cornea/Uveitis  [place]  Dept. of Ophthalmology

## 2023-07-29 ENCOUNTER — Other Ambulatory Visit (HOSPITAL_BASED_OUTPATIENT_CLINIC_OR_DEPARTMENT_OTHER): Payer: Self-pay
